# Patient Record
Sex: Male | Born: 1975 | Race: White | Hispanic: No | Marital: Single | Smoking: Never smoker
Health system: Southern US, Community
[De-identification: ages and names within clinical notes are randomized; demographics above are authoritative.]

## PROBLEM LIST (undated history)

## (undated) DIAGNOSIS — F419 Anxiety disorder, unspecified: Secondary | ICD-10-CM

## (undated) DIAGNOSIS — F32A Depression, unspecified: Secondary | ICD-10-CM

## (undated) DIAGNOSIS — F329 Major depressive disorder, single episode, unspecified: Secondary | ICD-10-CM

## (undated) HISTORY — DX: Anxiety disorder, unspecified: F41.9

## (undated) HISTORY — DX: Major depressive disorder, single episode, unspecified: F32.9

## (undated) HISTORY — DX: Depression, unspecified: F32.A

---

## 2013-09-19 ENCOUNTER — Ambulatory Visit: Payer: PRIVATE HEALTH INSURANCE

## 2013-09-19 ENCOUNTER — Ambulatory Visit (INDEPENDENT_AMBULATORY_CARE_PROVIDER_SITE_OTHER): Payer: PRIVATE HEALTH INSURANCE | Admitting: Family Medicine

## 2013-09-19 VITALS — BP 110/74 | HR 92 | Temp 98.7°F | Resp 18 | Ht 71.0 in | Wt 234.0 lb

## 2013-09-19 DIAGNOSIS — R05 Cough: Secondary | ICD-10-CM

## 2013-09-19 DIAGNOSIS — J111 Influenza due to unidentified influenza virus with other respiratory manifestations: Secondary | ICD-10-CM

## 2013-09-19 DIAGNOSIS — R509 Fever, unspecified: Secondary | ICD-10-CM

## 2013-09-19 DIAGNOSIS — J101 Influenza due to other identified influenza virus with other respiratory manifestations: Secondary | ICD-10-CM

## 2013-09-19 DIAGNOSIS — R059 Cough, unspecified: Secondary | ICD-10-CM

## 2013-09-19 LAB — POCT INFLUENZA A/B
INFLUENZA A, POC: POSITIVE
Influenza B, POC: NEGATIVE

## 2013-09-19 MED ORDER — IPRATROPIUM BROMIDE 0.03 % NA SOLN: 2.0000 | Freq: Four times a day (QID) | NASAL | Status: AC

## 2013-09-19 MED ORDER — HYDROCOD POLST-CHLORPHEN POLST 10-8 MG/5ML PO LQCR: 5.0000 mL | Freq: Two times a day (BID) | ORAL | Status: AC | PRN

## 2013-09-19 MED ORDER — OSELTAMIVIR PHOSPHATE 75 MG PO CAPS: 75.0000 mg | ORAL_CAPSULE | Freq: Two times a day (BID) | ORAL | Status: AC

## 2013-09-19 MED ORDER — PSEUDOEPHEDRINE HCL ER 120 MG PO TB12: 120.0000 mg | ORAL_TABLET | Freq: Two times a day (BID) | ORAL | Status: AC

## 2013-09-19 NOTE — Patient Instructions (Signed)

## 2013-09-19 NOTE — Progress Notes (Addendum)
This chart was scribed for Levell JulyEva N. Katrinka BlazingSmith, MD by Luisa DagoPriscilla Tutu, ED Scribe. This patient was seen in room 13 and the patient's care was started at 9:05 AM. Subjective:    Patient ID: Martin Dixon, male    DOB: 05-07-1976, 38 y.o.   MRN: [id number]  Chief Complaint  Patient presents with  . Fever    102 this morning sx's x2 weeks   . Nasal Congestion    HPI HPI Comments: Martin Dixon is a 38 y.o. male who presents to [facility] complaining of a fever that started this morning with a TMAX of 102. Pt states that he has been sick with cold symptoms for the past two weeks on and off and he feels like he never really got completely better.  However, yesterday he became acutely worse with severe fever, chills, aches.  Has been taking Tylenol and Nyquil to keep his fever down. Pt is also complaining of associated fatigue, congestion, productive cough with yellow sputum, headache, and myalgia.   Pt did not get a flu vaccine this season.    Past Medical History  Diagnosis Date  . Depression   . Anxiety    Allergies  Allergen Reactions  . Flagyl [Metronidazole]    No current outpatient prescriptions on file prior to visit.   No current facility-administered medications on file prior to visit.    Review of Systems  Constitutional: Positive for fever, chills, diaphoresis, activity change, appetite change and fatigue. Negative for unexpected weight change.  HENT: Positive for congestion and rhinorrhea. Negative for ear pain, sinus pressure, sore throat and trouble swallowing.   Respiratory: Positive for cough. Negative for chest tightness, shortness of breath and wheezing.   Cardiovascular: Negative for chest pain.  Gastrointestinal: Negative for nausea and vomiting.  Musculoskeletal: Positive for back pain, myalgias and neck stiffness.  Neurological: Positive for headaches.  Psychiatric/Behavioral: Negative for sleep disturbance.      Triage Vitals: BP 110/74   Pulse 92  Temp(Src) 98.7 F (37.1 C) (Oral)  Resp 18  Ht 5\' 11"  (1.803 m)  Wt 234 lb (106.142 kg)  BMI 32.65 kg/m2  SpO2 96% Objective:   Physical Exam  Nursing note and vitals reviewed. Constitutional: He is oriented to person, place, and time. He appears well-developed and well-nourished. No distress.  HENT:  Head: Normocephalic and atraumatic.  Right Ear: External ear and ear canal normal. Tympanic membrane is erythematous.  Left Ear: External ear and ear canal normal. Tympanic membrane is injected and erythematous.  Nose: Mucosal edema present. No rhinorrhea.  Mouth/Throat: Oropharynx is clear and moist and mucous membranes are normal. No oropharyngeal exudate.  Nasal edema left> right.  Eyes: Conjunctivae are normal. No scleral icterus.  Neck: Normal range of motion. Neck supple. No thyromegaly present.  Cardiovascular: Normal rate, regular rhythm, S1 normal, S2 normal, normal heart sounds and intact distal pulses.   Pulmonary/Chest: Effort normal and breath sounds normal. No respiratory distress. He has no wheezes. He has no rales.  Abdominal: He exhibits no distension.  Musculoskeletal: He exhibits no edema.  Lymphadenopathy:    He has no cervical adenopathy.    He has no axillary adenopathy.  Neurological: He is alert and oriented to person, place, and time.  Skin: Skin is warm and dry. He is not diaphoretic. No erythema.  Psychiatric: He has a normal mood and affect. His behavior is normal.    Results for orders placed in visit on 09/19/13  POCT INFLUENZA  A/B      Result Value Range   Influenza A, POC Positive     Influenza B, POC Negative      Primary X-ray reading By Dr Clelia Croft CXR: Normal  CLINICAL DATA: Cold symptoms for 2 weeks. Fever.  EXAM: CHEST 2 VIEW  COMPARISON: None.  FINDINGS: Normal cardiac and mediastinal contours. No consolidative pulmonary opacities. No pleural effusion or pneumothorax. Regional skeleton is unremarkable.  IMPRESSION: No  acute cardiopulmonary process    Assessment & Plan:  Fever, unspecified - Plan: POCT Influenza A/B, CANCELED: POCT CBC  Cough - Plan: DG Chest 2 View  Influenza A  Meds ordered this encounter  Medications  . oseltamivir (TAMIFLU) 75 MG capsule    Sig: Take 1 capsule (75 mg total) by mouth 2 (two) times daily.    Dispense:  10 capsule    Refill:  0  . chlorpheniramine-HYDROcodone (TUSSIONEX PENNKINETIC ER) 10-8 MG/5ML LQCR    Sig: Take 5 mLs by mouth every 12 (twelve) hours as needed.    Dispense:  120 mL    Refill:  0  . pseudoephedrine (SUDAFED 12 HOUR) 120 MG 12 hr tablet    Sig: Take 1 tablet (120 mg total) by mouth 2 (two) times daily.    Dispense:  30 tablet    Refill:  0  . ipratropium (ATROVENT) 0.03 % nasal spray    Sig: Place 2 sprays into the nose 4 (four) times daily.    Dispense:  30 mL    Refill:  1    I personally performed the services described in this documentation, which was scribed in my presence. The recorded information has been reviewed and considered, and addended by me as needed.  Norberto Sorenson, MD MPH

## 2015-06-20 ENCOUNTER — Other Ambulatory Visit: Payer: Self-pay | Admitting: Family Medicine

## 2015-06-20 DIAGNOSIS — K439 Ventral hernia without obstruction or gangrene: Secondary | ICD-10-CM

## 2015-06-21 ENCOUNTER — Ambulatory Visit
Admission: RE | Admit: 2015-06-21 | Discharge: 2015-06-21 | Disposition: A | Discharge status: Home / Self Care | Payer: BLUE CROSS/BLUE SHIELD | Source: Ambulatory Visit | Attending: Family Medicine | Admitting: Family Medicine

## 2015-06-21 DIAGNOSIS — K439 Ventral hernia without obstruction or gangrene: Secondary | ICD-10-CM

## 2016-03-31 IMAGING — US US PELVIS LIMITED
1 series · 14 of 14 positions shown · non-contrast
Comparison: None.

CLINICAL DATA: Evaluate for left spigelian hernia, pain

EXAM:
LIMITED ULTRASOUND OF PELVIS
TECHNIQUE: Limited transabdominal ultrasound examination of the pelvis was
performed.

[Series 1: us pelvis limited · 0.13mm/px · 14 acquisitions, 14 frames shown]
[im 1/14]
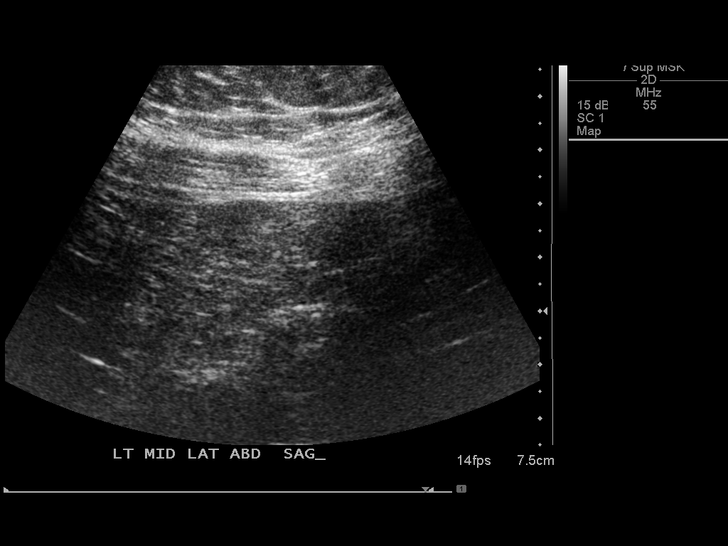
[im 2/14]
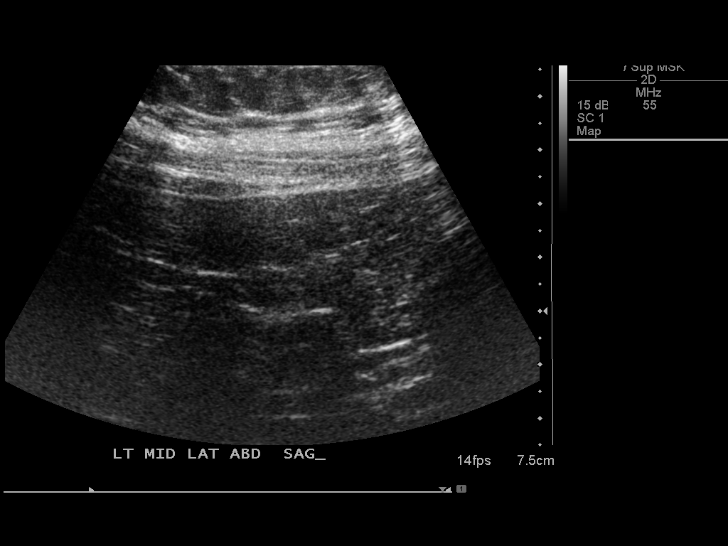
[im 3/14]
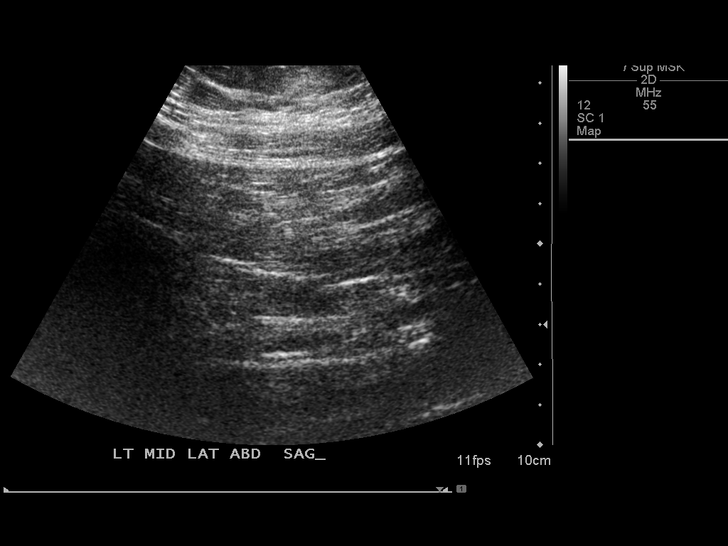
[im 4/14]
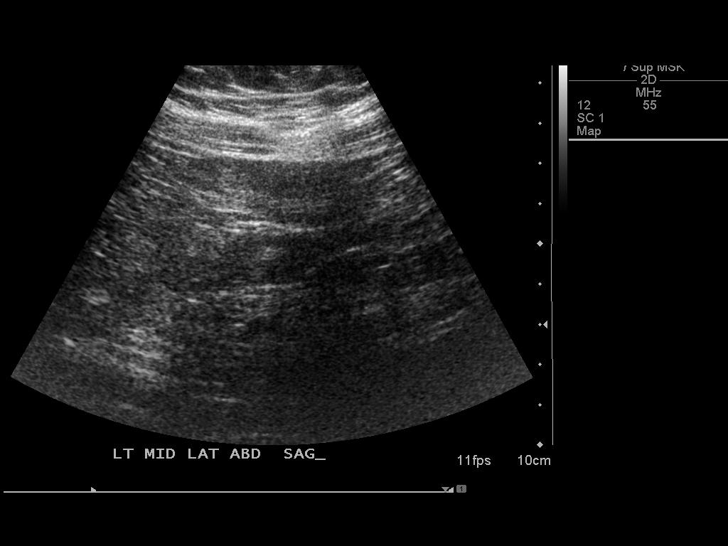
[im 5/14]
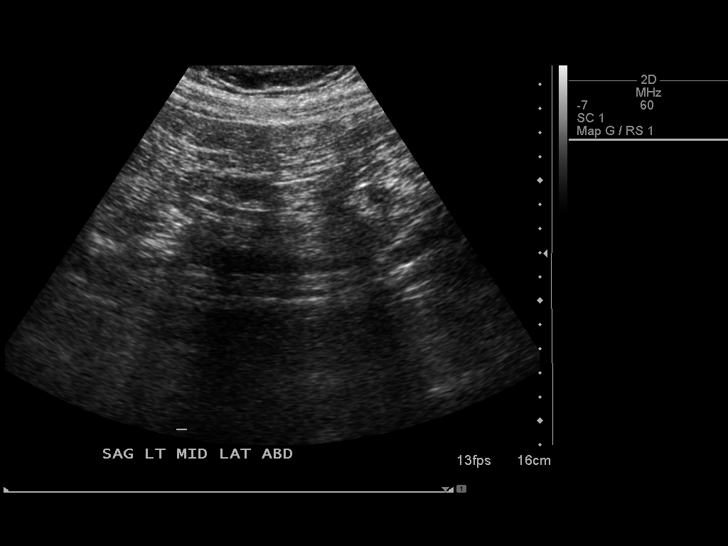
[im 6/14]
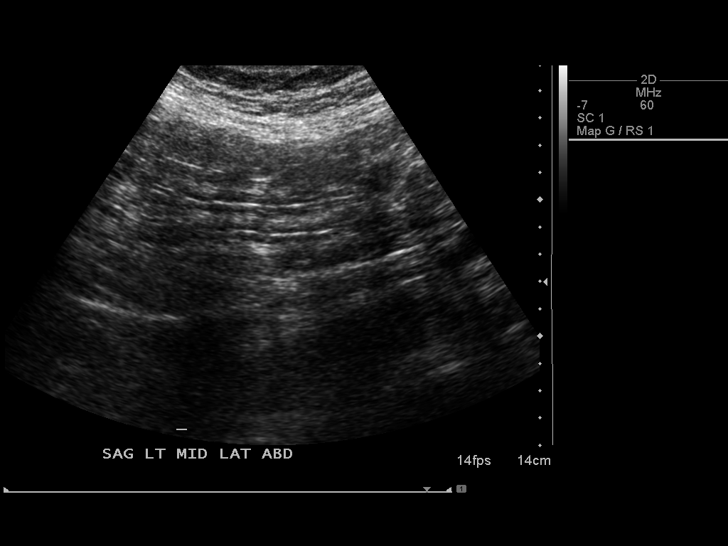
[im 7/14]
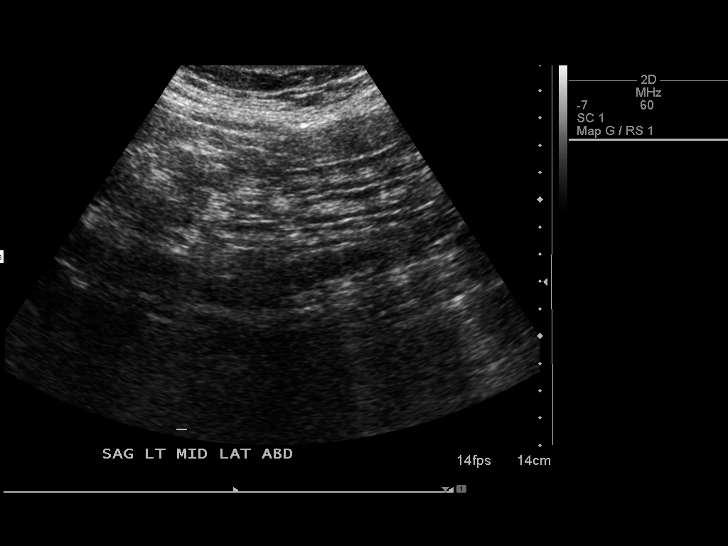
[im 8/14]
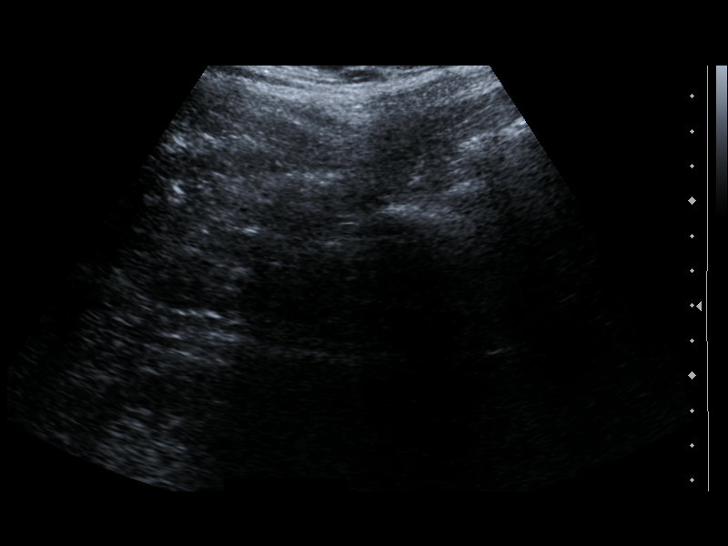
[im 9/14]
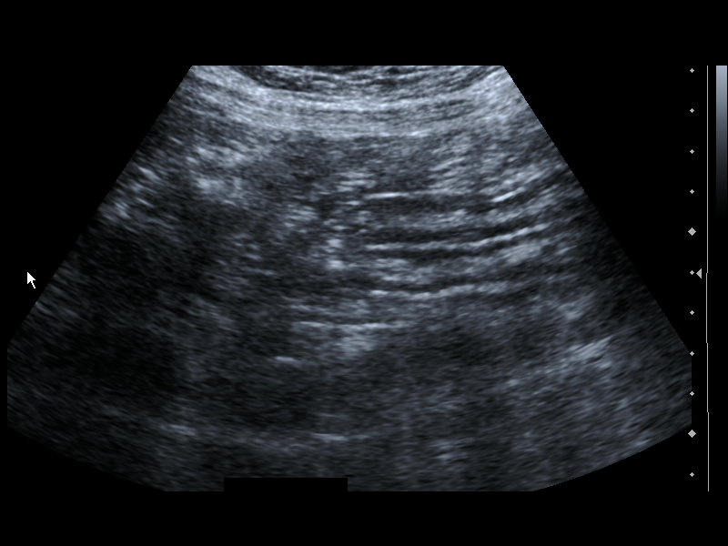
[im 10/14]
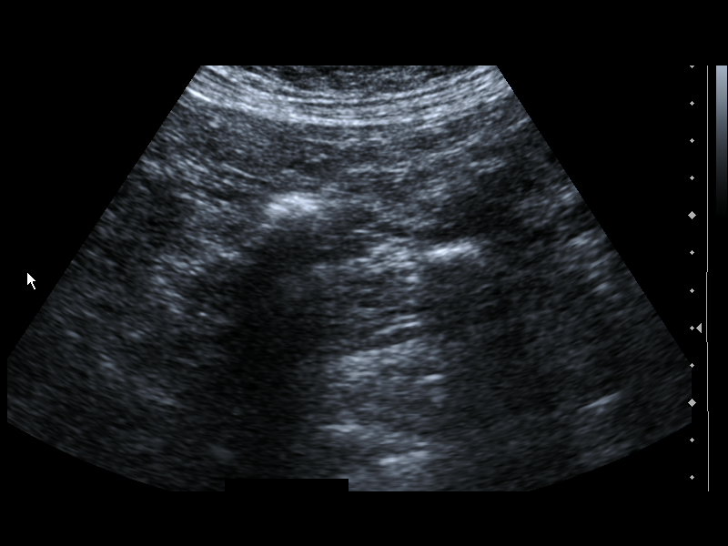
[im 11/14]
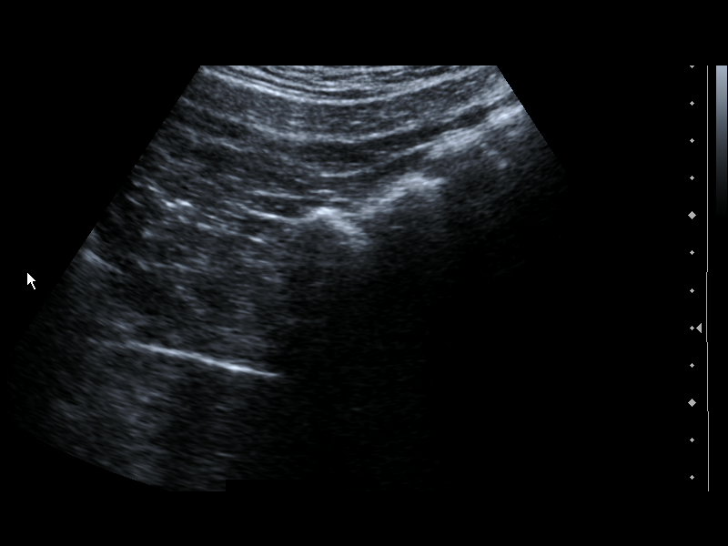
[im 12/14]
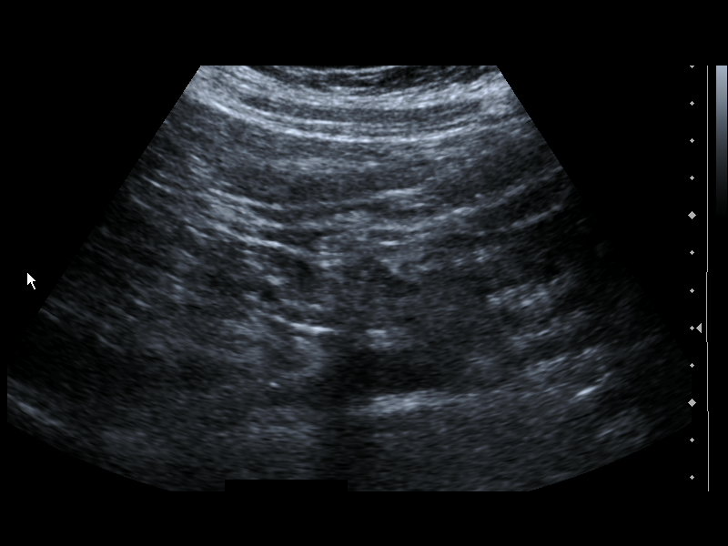
[im 13/14]
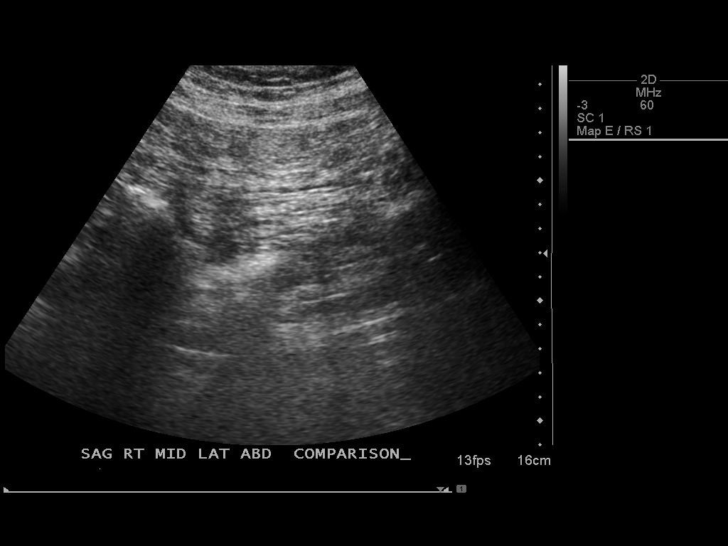
[im 14/14]
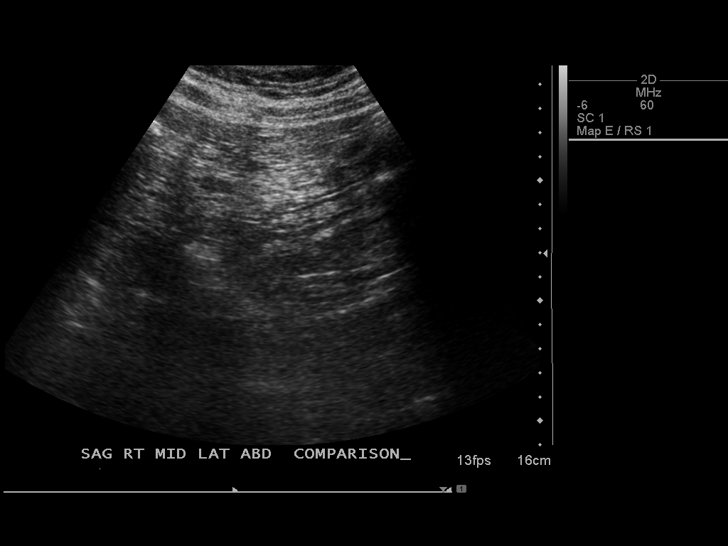

[14 of 14 positions shown; findings below may reference images not displayed]

FINDINGS: Ultrasound over the left abdomen was performed at the site in
question. Subcutaneous fat is evident. However no evidence of hernia
is seen. If more sensitive assessment is warranted CT through this
region could be performed.
IMPRESSION: No abdominal wall hernia is seen.
